# Patient Record
Sex: Female | Born: 2000 | Race: White | Hispanic: No | Marital: Single | Smoking: Never smoker
Health system: Southern US, Community
[De-identification: ages and names within clinical notes are randomized; demographics above are authoritative.]

## PROBLEM LIST (undated history)

## (undated) DIAGNOSIS — R4589 Other symptoms and signs involving emotional state: Secondary | ICD-10-CM

## (undated) DIAGNOSIS — F329 Major depressive disorder, single episode, unspecified: Secondary | ICD-10-CM

## (undated) DIAGNOSIS — F938 Other childhood emotional disorders: Secondary | ICD-10-CM

---
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
## 2021-03-18 ENCOUNTER — Emergency Department (HOSPITAL_COMMUNITY): Payer: Medicaid Other

## 2021-03-18 ENCOUNTER — Emergency Department (HOSPITAL_COMMUNITY)
Admission: EM | Admit: 2021-03-18 | Discharge: 2021-03-19 | Disposition: A | Discharge status: Home / Self Care | Payer: Medicaid Other | Attending: Physician Assistant | Admitting: Physician Assistant

## 2021-03-18 DIAGNOSIS — R55 Syncope and collapse: Secondary | ICD-10-CM | POA: Diagnosis not present

## 2021-03-18 DIAGNOSIS — R110 Nausea: Secondary | ICD-10-CM | POA: Insufficient documentation

## 2021-03-18 DIAGNOSIS — S0990XA Unspecified injury of head, initial encounter: Secondary | ICD-10-CM | POA: Insufficient documentation

## 2021-03-18 DIAGNOSIS — W01198A Fall on same level from slipping, tripping and stumbling with subsequent striking against other object, initial encounter: Secondary | ICD-10-CM | POA: Diagnosis not present

## 2021-03-18 DIAGNOSIS — R0602 Shortness of breath: Secondary | ICD-10-CM | POA: Diagnosis not present

## 2021-03-18 DIAGNOSIS — R42 Dizziness and giddiness: Secondary | ICD-10-CM | POA: Diagnosis not present

## 2021-03-18 DIAGNOSIS — Z5321 Procedure and treatment not carried out due to patient leaving prior to being seen by health care provider: Secondary | ICD-10-CM | POA: Diagnosis not present

## 2021-03-18 LAB — CBC WITH DIFFERENTIAL/PLATELET
Abs Immature Granulocytes: 0 10*3/uL (ref 0.00–0.07)
Basophils Absolute: 0 10*3/uL (ref 0.0–0.1)
Basophils Relative: 1 %
Eosinophils Absolute: 0 10*3/uL (ref 0.0–0.5)
Eosinophils Relative: 1 %
HCT: 36.3 % (ref 36.0–46.0)
Hemoglobin: 12.2 g/dL (ref 12.0–15.0)
Immature Granulocytes: 0 %
Lymphocytes Relative: 15 %
Lymphs Abs: 0.4 10*3/uL — ABNORMAL LOW (ref 0.7–4.0)
MCH: 32.3 pg (ref 26.0–34.0)
MCHC: 33.6 g/dL (ref 30.0–36.0)
MCV: 96 fL (ref 80.0–100.0)
Monocytes Absolute: 0.3 10*3/uL (ref 0.1–1.0)
Monocytes Relative: 12 %
Neutro Abs: 1.8 10*3/uL (ref 1.7–7.7)
Neutrophils Relative %: 71 %
Platelets: 152 10*3/uL (ref 150–400)
RBC: 3.78 MIL/uL — ABNORMAL LOW (ref 3.87–5.11)
RDW: 11.9 % (ref 11.5–15.5)
WBC: 2.6 10*3/uL — ABNORMAL LOW (ref 4.0–10.5)
nRBC: 0 % (ref 0.0–0.2)

## 2021-03-18 LAB — COMPREHENSIVE METABOLIC PANEL
ALT: 20 U/L (ref 0–44)
AST: 17 U/L (ref 15–41)
Albumin: 4.1 g/dL (ref 3.5–5.0)
Alkaline Phosphatase: 61 U/L (ref 38–126)
Anion gap: 8 (ref 5–15)
BUN: 6 mg/dL (ref 6–20)
CO2: 27 mmol/L (ref 22–32)
Calcium: 9.2 mg/dL (ref 8.9–10.3)
Chloride: 102 mmol/L (ref 98–111)
Creatinine, Ser: 0.52 mg/dL (ref 0.44–1.00)
GFR, Estimated: >60 mL/min (ref >60)
Glucose, Bld: 93 mg/dL (ref 70–99)
Potassium: 3.7 mmol/L (ref 3.5–5.1)
Sodium: 137 mmol/L (ref 135–145)
Total Bilirubin: 1.1 mg/dL (ref 0.3–1.2)
Total Protein: 6.4 g/dL — ABNORMAL LOW (ref 6.5–8.1)

## 2021-03-18 LAB — I-STAT BETA HCG BLOOD, ED (MC, WL, AP ONLY): I-stat hCG, quantitative: <5 m[IU]/mL (ref <5)

## 2021-03-18 NOTE — ED Provider Notes (Signed)
Emergency Medicine Provider Triage Evaluation Note  Dana Chavez , a 20 y.o. female  was evaluated in triage.  Pt complains of syncope. States she typically will have about 2 syncopal episodes per month where she briefly loses consciousness.  She states that 2 days ago she began feeling lightheaded, she started to sit down, and then she lost consciousness and fell backwards striking her posterior scalp on the concrete surface.  She states that when this occurred she had mild shortness of breath and felt nauseated.  This quickly resolved.  She states that she immediately regained consciousness.  Since this occurred she has had a posterior headache that has been constant and has not improved.  Reports associated light sensitivity.  No numbness or weakness.  No chest pain or shortness of breath.  No vomiting.  Physical Exam  BP (!) 126/102 (BP Location: Left Arm)   Pulse (!) 114   Temp 98.5 F (36.9 C) (Oral)   Resp 16   Ht 5\' 1"  (1.549 m)   Wt 54.4 kg   SpO2 99%   BMI 22.67 kg/m  Gen:   Awake, no distress   Resp:  Normal effort  MSK:   Moves extremities without difficulty  Other:    Medical Decision Making  Medically screening exam initiated at 2:49 PM.  Appropriate orders placed.  Zaria Rosado was informed that the remainder of the evaluation will be completed by another provider, this initial triage assessment does not replace that evaluation, and the importance of remaining in the ED until their evaluation is complete.   Fenton Malling, PA-C 03/18/21 1451    05/19/21, MD 03/22/21 [phone]
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
## 2021-03-18 NOTE — ED Notes (Signed)
Patient called for room assignement x1 with no response

## 2021-04-10 ENCOUNTER — Other Ambulatory Visit: Payer: Self-pay | Admitting: Internal Medicine

## 2021-04-11 LAB — C. TRACHOMATIS/N. GONORRHOEAE RNA
C. trachomatis RNA, TMA: NOT DETECTED
N. gonorrhoeae RNA, TMA: NOT DETECTED

## 2021-04-11 LAB — CBC
HCT: 37.6 % (ref 35.0–45.0)
Hemoglobin: 12.7 g/dL (ref 11.7–15.5)
MCH: 32.1 pg (ref 27.0–33.0)
MCHC: 33.8 g/dL (ref 32.0–36.0)
MCV: 94.9 fL (ref 80.0–100.0)
MPV: 12.6 fL — ABNORMAL HIGH (ref 7.5–12.5)
Platelets: 183 10*3/uL (ref 140–400)
RBC: 3.96 10*6/uL (ref 3.80–5.10)
RDW: 11.8 % (ref 11.0–15.0)
WBC: 3.8 10*3/uL (ref 3.8–10.8)

## 2021-04-11 LAB — LIPID PANEL
Cholesterol: 147 mg/dL (ref <170)
HDL: 63 mg/dL (ref >45)
LDL Cholesterol (Calc): 70 mg/dL (calc) (ref <110)
Non-HDL Cholesterol (Calc): 84 mg/dL (calc) (ref <120)
Total CHOL/HDL Ratio: 2.3 (calc) (ref <5.0)
Triglycerides: 48 mg/dL (ref <90)

## 2021-04-11 LAB — COMPLETE METABOLIC PANEL WITH GFR
AG Ratio: 2 (calc) (ref 1.0–2.5)
ALT: 25 U/L (ref 5–32)
AST: 22 U/L (ref 12–32)
Albumin: 4.3 g/dL (ref 3.6–5.1)
Alkaline phosphatase (APISO): 67 U/L (ref 36–128)
BUN: 11 mg/dL (ref 7–20)
CO2: 21 mmol/L (ref 20–32)
Calcium: 9.3 mg/dL (ref 8.9–10.4)
Chloride: 107 mmol/L (ref 98–110)
Creat: 0.67 mg/dL (ref 0.50–0.96)
Globulin: 2.2 g/dL (calc) (ref 2.0–3.8)
Glucose, Bld: 96 mg/dL (ref 65–99)
Potassium: 4.1 mmol/L (ref 3.8–5.1)
Sodium: 139 mmol/L (ref 135–146)
Total Bilirubin: 0.6 mg/dL (ref 0.2–1.1)
Total Protein: 6.5 g/dL (ref 6.3–8.2)
eGFR: 129 mL/min/{1.73_m2} (ref >=60)

## 2021-04-11 LAB — VITAMIN D 25 HYDROXY (VIT D DEFICIENCY, FRACTURES): Vit D, 25-Hydroxy: 21 ng/mL — ABNORMAL LOW (ref 30–100)

## 2021-04-11 LAB — TSH: TSH: 2.54 mIU/L
